# Patient Record
Sex: Male | Born: 1995 | Race: Black or African American | Hispanic: No | Marital: Single | Smoking: Current every day smoker
Health system: Southern US, Community
[De-identification: ages and names within clinical notes are randomized; demographics above are authoritative.]

## PROBLEM LIST (undated history)

## (undated) DIAGNOSIS — J029 Acute pharyngitis, unspecified: Secondary | ICD-10-CM

---
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
## 2015-01-10 ENCOUNTER — Encounter (HOSPITAL_BASED_OUTPATIENT_CLINIC_OR_DEPARTMENT_OTHER): Payer: Self-pay

## 2015-01-10 ENCOUNTER — Emergency Department (HOSPITAL_BASED_OUTPATIENT_CLINIC_OR_DEPARTMENT_OTHER)
Admission: EM | Admit: 2015-01-10 | Discharge: 2015-01-10 | Disposition: A | Discharge status: Home / Self Care | Payer: Medicaid Other | Attending: Emergency Medicine | Admitting: Emergency Medicine

## 2015-01-10 DIAGNOSIS — Z720 Tobacco use: Secondary | ICD-10-CM | POA: Diagnosis not present

## 2015-01-10 DIAGNOSIS — Y9289 Other specified places as the place of occurrence of the external cause: Secondary | ICD-10-CM | POA: Insufficient documentation

## 2015-01-10 DIAGNOSIS — R110 Nausea: Secondary | ICD-10-CM | POA: Diagnosis not present

## 2015-01-10 DIAGNOSIS — S39012A Strain of muscle, fascia and tendon of lower back, initial encounter: Secondary | ICD-10-CM | POA: Insufficient documentation

## 2015-01-10 DIAGNOSIS — X58XXXA Exposure to other specified factors, initial encounter: Secondary | ICD-10-CM | POA: Diagnosis not present

## 2015-01-10 DIAGNOSIS — S3992XA Unspecified injury of lower back, initial encounter: Secondary | ICD-10-CM | POA: Diagnosis present

## 2015-01-10 DIAGNOSIS — M25562 Pain in left knee: Secondary | ICD-10-CM | POA: Diagnosis not present

## 2015-01-10 DIAGNOSIS — Y9389 Activity, other specified: Secondary | ICD-10-CM | POA: Insufficient documentation

## 2015-01-10 DIAGNOSIS — Y998 Other external cause status: Secondary | ICD-10-CM | POA: Diagnosis not present

## 2015-01-10 DIAGNOSIS — T148XXA Other injury of unspecified body region, initial encounter: Secondary | ICD-10-CM

## 2015-01-10 NOTE — Discharge Instructions (Signed)
You may take Ibuprofen and use ice for pain relief.  Please follow up with a primary care provider listed in the Resource Guide provided below in 1 week. Please return to the Emergency Department if symptoms worsen or new onset of fever, numbness, tingling, loss of bowel or bladder, weakness.

## 2015-01-10 NOTE — ED Notes (Signed)
Patient here with multiple complaints of back pain, left knee pain and nausea. He thinks the nausea is related to what he ate last night. Reports knee and back hurting for a while

## 2015-01-10 NOTE — ED Provider Notes (Signed)
CSN: [id number]     Arrival date & time 01/10/15  1255 History   First MD Initiated Contact with Patient 01/10/15 1328     Chief Complaint  Patient presents with  . multiple complaints      (Consider location/radiation/quality/duration/timing/severity/associated sxs/prior Treatment) HPI Comments: Pt is a 19 yo male who presents to the ED with complaint of of lower back pain. Pt reports he has been having intermittent "aching" lower back pain for the past 3 months that is worse with movement. He denies any recent falls, trauma or injury. Pt denies fever, numbness, tingling, loss of bowel or bladder, weakness, IVDU, cancer or recent spinal manipulation. He also reports having left knee pain that started yesterday while skateboarding. He notes he was skateboarding fell off his board and landed in a squatting position with most of his weight no his left knee. He endorses constant aching pain to the left knee, no aggravating or alleviating factors. He also reports being nauseous last night after eating chinese food but notes his nausea has improved. Denies HA, nasal congestion, rhinorrhea, sore throat, cough, SOB, CP, abdominal pain, vomiting, diarrhea, urinary sxs, blood in urine or stool. Pt states he has not taken anything at home for his sxs. No PCP.   History reviewed. No pertinent past medical history. History reviewed. No pertinent past surgical history. No family history on file. Social History  Substance Use Topics  . Smoking status: Current Every Day Smoker  . Smokeless tobacco: None  . Alcohol Use: None    Review of Systems  Gastrointestinal: Positive for nausea.  Musculoskeletal: Positive for back pain.       Left knee pain  All other systems reviewed and are negative.     Allergies  Review of patient's allergies indicates no known allergies.  Home Medications   Prior to Admission medications   Not on File   BP 130/67 mmHg  Pulse 83  Temp(Src) 98.2 F (36.8 C) (Oral)   Resp 18  Ht  (1.88 m)  Wt 145 lb (65.772 kg)  BMI 18.61 kg/m2  SpO2 99% Physical Exam  Constitutional: He is oriented to person, place, and time. He appears well-developed and well-nourished. No distress.  HENT:  Head: Normocephalic and atraumatic.  Mouth/Throat: Oropharynx is clear and moist. No oropharyngeal exudate.  Eyes: Conjunctivae and EOM are normal. Pupils are equal, round, and reactive to light. Right eye exhibits no discharge. Left eye exhibits no discharge. No scleral icterus.  Neck: Normal range of motion. Neck supple.  Cardiovascular: Normal rate, regular rhythm, normal heart sounds and intact distal pulses.   No murmur heard. Pulmonary/Chest: Effort normal and breath sounds normal. No respiratory distress. He has no wheezes. He has no rales. He exhibits no tenderness.  Abdominal: Soft. Bowel sounds are normal. He exhibits no distension and no mass. There is no tenderness. There is no rebound and no guarding.  Musculoskeletal: Normal range of motion. He exhibits no edema or tenderness.       Left knee: Normal. He exhibits normal range of motion, no swelling, no effusion, no ecchymosis, no deformity, no laceration, no erythema, no LCL laxity, normal patellar mobility, no bony tenderness, normal meniscus and no MCL laxity. No tenderness found.       Lumbar back: Normal. He exhibits normal range of motion, no tenderness, no bony tenderness, no swelling, no edema, no deformity, no pain and no spasm.  Lymphadenopathy:    He has no cervical adenopathy.  Neurological: He is alert  and oriented to person, place, and time. He has normal strength and normal reflexes. No cranial nerve deficit or sensory deficit. Coordination and gait normal.  Skin: Skin is warm and dry. He is not diaphoretic.  Nursing note and vitals reviewed.   ED Course  Procedures (including critical care time) Labs Review Labs Reviewed - No data to display  Imaging Review No results found. I have  personally reviewed and evaluated these images and lab results as part of my medical decision-making.  Filed Vitals:   01/10/15 1302  BP: 130/67  Pulse: 83  Temp: 98.2 F (36.8 C)  Resp: 18     MDM   Final diagnoses:  Muscle strain    Pt presents with lower back pain x3 months , left knee pain x yesterday while skateboarding and nausea that occurred last night after eating chinese food that has since resolved. Denies any fall, trauma or injury prior to onset of back pain. Pt denies fever, numbness, tingling, loss of bowel or bladder, weakness, IVDU, cancer or recent spinal manipulation. VSS. Abdominal exam benign. MSK exam benign, FROM of back and BLE. Pt able to stand and ambulate in room. No signs of injury to left knee. I suspect back pain is likely due to musculoskeletal etiology with no red flags present or neuro sxs, likely due to muscle strain. I do not feel that imaging is warranted at this time to evaluate left knee with no signs of injury and normal exam. Discussed plan for d/c with pt. Pt advised to take ibuprofen and use ice for pain relief. Pt given Resource Guide to follow up with PCP.  Evaluation does not show pathology requring ongoing emergent intervention or admission. Pt is hemodynamically stable and mentating appropriately. Discussed findings/results and plan with patient/guardian, who agrees with plan. All questions answered. Return precautions discussed and outpatient follow up given.       Satira [place], [place] 01/10/15 1514  Elwin Mocha, MD 01/10/15 [phone]

## 2015-03-03 ENCOUNTER — Encounter (HOSPITAL_BASED_OUTPATIENT_CLINIC_OR_DEPARTMENT_OTHER): Payer: Self-pay

## 2015-08-31 ENCOUNTER — Encounter (HOSPITAL_BASED_OUTPATIENT_CLINIC_OR_DEPARTMENT_OTHER): Payer: Self-pay | Admitting: *Deleted

## 2015-08-31 DIAGNOSIS — Z5321 Procedure and treatment not carried out due to patient leaving prior to being seen by health care provider: Secondary | ICD-10-CM | POA: Diagnosis not present

## 2015-08-31 DIAGNOSIS — Z87891 Personal history of nicotine dependence: Secondary | ICD-10-CM | POA: Insufficient documentation

## 2015-08-31 DIAGNOSIS — J069 Acute upper respiratory infection, unspecified: Secondary | ICD-10-CM | POA: Diagnosis not present

## 2015-08-31 MED ORDER — ACETAMINOPHEN 325 MG PO TABS
650.0000 mg | ORAL_TABLET | Freq: Once | ORAL | Status: AC
  Administered 2015-08-31: 650 mg via ORAL
  Filled 2015-08-31: qty 2

## 2015-08-31 NOTE — ED Notes (Signed)
Nasal congestion, sneezing, and headache since yesterday.

## 2015-09-01 ENCOUNTER — Emergency Department (HOSPITAL_BASED_OUTPATIENT_CLINIC_OR_DEPARTMENT_OTHER)
Admission: EM | Admit: 2015-09-01 | Discharge: 2015-09-01 | Disposition: A | Discharge status: Home / Self Care | Payer: Medicaid Other | Attending: Dermatology | Admitting: Dermatology

## 2017-11-18 ENCOUNTER — Encounter (HOSPITAL_BASED_OUTPATIENT_CLINIC_OR_DEPARTMENT_OTHER): Payer: Self-pay | Admitting: *Deleted

## 2017-11-18 ENCOUNTER — Emergency Department (HOSPITAL_BASED_OUTPATIENT_CLINIC_OR_DEPARTMENT_OTHER)
Admission: EM | Admit: 2017-11-18 | Discharge: 2017-11-18 | Disposition: A | Discharge status: Home / Self Care | Payer: BLUE CROSS/BLUE SHIELD | Attending: Emergency Medicine | Admitting: Emergency Medicine

## 2017-11-18 ENCOUNTER — Other Ambulatory Visit: Payer: Self-pay

## 2017-11-18 DIAGNOSIS — R070 Pain in throat: Secondary | ICD-10-CM | POA: Diagnosis present

## 2017-11-18 DIAGNOSIS — Z87891 Personal history of nicotine dependence: Secondary | ICD-10-CM | POA: Insufficient documentation

## 2017-11-18 DIAGNOSIS — J039 Acute tonsillitis, unspecified: Secondary | ICD-10-CM | POA: Insufficient documentation

## 2017-11-18 LAB — GROUP A STREP BY PCR: Group A Strep by PCR: NOT DETECTED

## 2017-11-18 MED ORDER — AMOXICILLIN 500 MG PO CAPS: 1000.0000 mg | ORAL_CAPSULE | Freq: Every day | ORAL | 0 refills | Status: DC

## 2017-11-18 MED ORDER — IBUPROFEN 800 MG PO TABS
800.0000 mg | ORAL_TABLET | Freq: Once | ORAL | Status: AC
  Administered 2017-11-18: 800 mg via ORAL
  Filled 2017-11-18: qty 1

## 2017-11-18 MED ORDER — AMOXICILLIN 500 MG PO CAPS
1000.0000 mg | ORAL_CAPSULE | Freq: Once | ORAL | Status: AC
  Administered 2017-11-18: 1000 mg via ORAL
  Filled 2017-11-18: qty 2

## 2017-11-18 NOTE — ED Notes (Signed)
Alert, NAD, calm, interactive, resps e/u, speaking in clear complete sentences, no dyspnea noted, skin hot&D, c/o sore throat and fever, (denies: other pain, sob, nausea, dizziness or visual changes).

## 2017-11-18 NOTE — ED Provider Notes (Addendum)
   [facility] Provider Note: Lowella DellJ. Lane Brittney Caraway, MD, FACEP  CSN: [id number] MRN: [id number] ARRIVAL: 11/18/17 at 2059 ROOM: [facility]06/[facility]06   CHIEF COMPLAINT  Sore Throat   HISTORY OF PRESENT ILLNESS  11/18/17 11:12 PM Brandon Fisher is a 22 y.o. male with about 3 days of sore throat and 1 day of fever.  Pain is moderate and worse with swallowing.  His temperature was noted to be 102.2 on arrival and he was given ibuprofen with improvement in his fever as well as his pain.  Associated symptoms include headache and body aches.  He denies nasal congestion or cough.   History reviewed. No pertinent past medical history.  History reviewed. No pertinent surgical history.  History reviewed. No pertinent family history.  Social History   Tobacco Use  . Smoking status: Former Smoker  Substance Use Topics  . Alcohol use: No  . Drug use: No    Prior to Admission medications   Medication Sig Start Date End Date Taking? Authorizing Provider  loratadine (CLARITIN) 10 MG tablet Take 10 mg by mouth once.      [provider]    Allergies Patient has no known allergies.   REVIEW OF SYSTEMS  Negative except as noted here or in the History of Present Illness.   PHYSICAL EXAMINATION  Initial Vital Signs Blood pressure (!) 147/68, pulse 99, temperature (!) 102.2 F (39 C), temperature source (S) Oral, resp. rate 18, height 6\' 2"  (1.88 m), weight 72.6 kg, SpO2 98 %.  Examination General: Well-developed, well-nourished male in no acute distress; appearance consistent with age of record HENT: normocephalic; atraumatic; tonsillar exudate; no trismus; uvula midline Eyes: pupils equal, round and reactive to light; extraocular muscles intact Neck: supple; no lymphadenopathy Heart: regular rate and rhythm Lungs: clear to auscultation bilaterally Abdomen: soft; nondistended; nontender; bowel sounds present Extremities: No deformity; full range of motion Neurologic: Awake, alert  and oriented; motor function intact in all extremities and symmetric; no facial droop Skin: Warm and dry Psychiatric: Normal mood and affect   RESULTS  Summary of this visit's results, reviewed by myself:   EKG Interpretation  Date/Time:    Ventricular Rate:    PR Interval:    QRS Duration:   QT Interval:    QTC Calculation:   R Axis:     Text Interpretation:        Laboratory Studies: Results for orders placed or performed during the hospital encounter of 11/18/17 (from the past 24 hour(s))  Group A Strep by PCR     Status: None   Collection Time: 11/18/17  9:06 PM  Result Value Ref Range   Group A Strep by PCR NOT DETECTED NOT DETECTED   Imaging Studies: No results found.  ED COURSE and MDM  Nursing notes and initial vitals signs, including pulse oximetry, reviewed.  Vitals:   11/18/17 2103 11/18/17 2104 11/18/17 2247  BP: (!) 147/68    Pulse: 99    Resp: 18    Temp: 100.1 F (37.8 C)  (!) 102.2 F (39 C)  TempSrc: Oral  (S) Oral  SpO2: 98%    Weight:  72.6 kg   Height:  6\' 2"  (1.88 m)    Examination consistent with tonsillitis.  PROCEDURES    ED DIAGNOSES     ICD-10-CM   1. Tonsillitis J03.90        Brogan Martis, Jonny RuizJohn, MD 11/18/17 2318    Paula LibraMolpus, Lynix Bonine, MD 11/18/17 2320

## 2017-11-18 NOTE — ED Triage Notes (Signed)
Sore throat and fever x 4 days

## 2018-11-05 ENCOUNTER — Emergency Department (HOSPITAL_BASED_OUTPATIENT_CLINIC_OR_DEPARTMENT_OTHER)
Admission: EM | Admit: 2018-11-05 | Discharge: 2018-11-05 | Disposition: A | Discharge status: Home / Self Care | Payer: BC Managed Care – PPO | Attending: Emergency Medicine | Admitting: Emergency Medicine

## 2018-11-05 ENCOUNTER — Other Ambulatory Visit: Payer: Self-pay

## 2018-11-05 ENCOUNTER — Encounter (HOSPITAL_BASED_OUTPATIENT_CLINIC_OR_DEPARTMENT_OTHER): Payer: Self-pay | Admitting: *Deleted

## 2018-11-05 DIAGNOSIS — Z87891 Personal history of nicotine dependence: Secondary | ICD-10-CM | POA: Diagnosis not present

## 2018-11-05 DIAGNOSIS — J029 Acute pharyngitis, unspecified: Secondary | ICD-10-CM | POA: Diagnosis present

## 2018-11-05 DIAGNOSIS — Z20828 Contact with and (suspected) exposure to other viral communicable diseases: Secondary | ICD-10-CM | POA: Diagnosis not present

## 2018-11-05 DIAGNOSIS — R509 Fever, unspecified: Secondary | ICD-10-CM | POA: Insufficient documentation

## 2018-11-05 DIAGNOSIS — Z79899 Other long term (current) drug therapy: Secondary | ICD-10-CM | POA: Diagnosis not present

## 2018-11-05 LAB — GROUP A STREP BY PCR: Group A Strep by PCR: NOT DETECTED

## 2018-11-05 NOTE — ED Provider Notes (Signed)
[facility] EMERGENCY DEPARTMENT Provider Note   CSN: [id number] Arrival date & time: 11/05/18  1430     History   Chief Complaint Chief Complaint  Patient presents with  . Sore Throat    HPI Brandon Fisher is a 23 y.o. male.     Patient with a complaint of sore throat for 3 days.  Questionable fever.  Patient does get pharyngitis frequently last time was negative for strep pharyngitis.  No significant cough or shortness of breath.  No runny nose.     History reviewed. No pertinent past medical history.  There are no active problems to display for this patient.   History reviewed. No pertinent surgical history.      Home Medications    Prior to Admission medications   Medication Sig Start Date End Date Taking? Authorizing Provider  amoxicillin (AMOXIL) 500 MG capsule Take 2 capsules (1,000 mg total) by mouth daily. 11/18/17   Molpus, John, MD  loratadine (CLARITIN) 10 MG tablet Take 10 mg by mouth once.      [provider]    Family History No family history on file.  Social History Social History   Tobacco Use  . Smoking status: Former Games developermoker  . Smokeless tobacco: Never Used  Substance Use Topics  . Alcohol use: No  . Drug use: No     Allergies   Patient has no known allergies.   Review of Systems Review of Systems  Constitutional: Positive for fever. Negative for chills.  HENT: Positive for sore throat. Negative for congestion, rhinorrhea and trouble swallowing.   Eyes: Negative for visual disturbance.  Respiratory: Negative for cough and shortness of breath.   Cardiovascular: Negative for chest pain and leg swelling.  Gastrointestinal: Negative for abdominal pain, diarrhea, nausea and vomiting.  Genitourinary: Negative for dysuria.  Musculoskeletal: Negative for back pain and neck pain.  Skin: Negative for rash.  Neurological: Negative for dizziness, light-headedness and headaches.  Hematological: Does not bruise/bleed  easily.  Psychiatric/Behavioral: Negative for confusion.     Physical Exam Updated Vital Signs BP 140/78 (BP Location: Right Arm)   Pulse 62   Temp 98.3 F (36.8 C) (Oral)   Resp 14   Ht 1.905 m (6\' 3" )   Wt 72.6 kg   SpO2 100%   BMI 20.00 kg/m   Physical Exam Vitals signs and nursing note reviewed.  Constitutional:      Appearance: Normal appearance. He is well-developed.  HENT:     Head: Normocephalic and atraumatic.     Mouth/Throat:     Mouth: Mucous membranes are moist.     Pharynx: Posterior oropharyngeal erythema present. No oropharyngeal exudate.     Comments: Uvula midline.  Tonsils not enlarged.  No exudate. Eyes:     Extraocular Movements: Extraocular movements intact.     Conjunctiva/sclera: Conjunctivae normal.     Pupils: Pupils are equal, round, and reactive to light.  Neck:     Musculoskeletal: Normal range of motion and neck supple. No neck rigidity.  Cardiovascular:     Rate and Rhythm: Normal rate and regular rhythm.     Heart sounds: No murmur.  Pulmonary:     Effort: Pulmonary effort is normal. No respiratory distress.     Breath sounds: Normal breath sounds.  Abdominal:     Palpations: Abdomen is soft.     Tenderness: There is no abdominal tenderness.  Musculoskeletal: Normal range of motion.  Skin:    General: Skin is warm and  dry.  Neurological:     General: No focal deficit present.     Mental Status: He is alert and oriented to person, place, and time.      ED Treatments / Results  Labs (all labs ordered are listed, but only abnormal results are displayed) Labs Reviewed  GROUP A STREP BY PCR  NOVEL CORONAVIRUS, NAA (HOSPITAL ORDER, SEND-OUT TO REF LAB)    EKG None  Radiology No results found.  Procedures Procedures (including critical care time)  Medications Ordered in ED Medications - No data to display   Initial Impression / Assessment and Plan / ED Course  I have reviewed the triage vital signs and the nursing  notes.  Pertinent labs & imaging results that were available during my care of the patient were reviewed by me and considered in my medical decision making (see chart for details).        Strep test negative.  Symptoms early on for checking for mononucleosis.  But patient with questionable fever so we will have COVID testing and will self quarantine.  Work note provided.  Outpatient COVID test results should be back in 2 to 4 days.  Patient will return for any new or worse symptoms.  Final Clinical Impressions(s) / ED Diagnoses   Final diagnoses:  Pharyngitis, unspecified etiology    ED Discharge Orders    None       Fredia Sorrow, MD 11/05/18 1627

## 2018-11-05 NOTE — Discharge Instructions (Addendum)
He should receive your COVID test results in 2 to 4 days.  Self quarantine at home in the meantime.  Work note provided.  Recommend Tylenol for the sore throat.  Return for any new or worse symptoms.  Strep testing was negative.

## 2018-11-05 NOTE — ED Triage Notes (Signed)
Sore throat x 3 days

## 2018-11-07 LAB — NOVEL CORONAVIRUS, NAA (HOSP ORDER, SEND-OUT TO REF LAB; TAT 18-24 HRS): SARS-CoV-2, NAA: NOT DETECTED

## 2018-12-03 ENCOUNTER — Emergency Department (HOSPITAL_BASED_OUTPATIENT_CLINIC_OR_DEPARTMENT_OTHER): Payer: BC Managed Care – PPO

## 2018-12-03 ENCOUNTER — Emergency Department (HOSPITAL_BASED_OUTPATIENT_CLINIC_OR_DEPARTMENT_OTHER)
Admission: EM | Admit: 2018-12-03 | Discharge: 2018-12-03 | Disposition: A | Discharge status: Home / Self Care | Payer: BC Managed Care – PPO | Attending: Emergency Medicine | Admitting: Emergency Medicine

## 2018-12-03 ENCOUNTER — Other Ambulatory Visit: Payer: Self-pay

## 2018-12-03 ENCOUNTER — Encounter (HOSPITAL_BASED_OUTPATIENT_CLINIC_OR_DEPARTMENT_OTHER): Payer: Self-pay | Admitting: Emergency Medicine

## 2018-12-03 DIAGNOSIS — R509 Fever, unspecified: Secondary | ICD-10-CM | POA: Diagnosis present

## 2018-12-03 DIAGNOSIS — J988 Other specified respiratory disorders: Secondary | ICD-10-CM

## 2018-12-03 DIAGNOSIS — Z87891 Personal history of nicotine dependence: Secondary | ICD-10-CM | POA: Insufficient documentation

## 2018-12-03 DIAGNOSIS — Z20828 Contact with and (suspected) exposure to other viral communicable diseases: Secondary | ICD-10-CM | POA: Insufficient documentation

## 2018-12-03 DIAGNOSIS — J069 Acute upper respiratory infection, unspecified: Secondary | ICD-10-CM | POA: Diagnosis not present

## 2018-12-03 DIAGNOSIS — B9789 Other viral agents as the cause of diseases classified elsewhere: Secondary | ICD-10-CM

## 2018-12-03 HISTORY — DX: Acute pharyngitis, unspecified: J02.9

## 2018-12-03 LAB — CBC WITH DIFFERENTIAL/PLATELET
Abs Immature Granulocytes: 0.07 10*3/uL (ref 0.00–0.07)
Basophils Absolute: 0 10*3/uL (ref 0.0–0.1)
Basophils Relative: 0 %
Eosinophils Absolute: 0 10*3/uL (ref 0.0–0.5)
Eosinophils Relative: 0 %
HCT: 41.8 % (ref 39.0–52.0)
Hemoglobin: 14.2 g/dL (ref 13.0–17.0)
Immature Granulocytes: 1 %
Lymphocytes Relative: 6 %
Lymphs Abs: 0.7 10*3/uL (ref 0.7–4.0)
MCH: 30.5 pg (ref 26.0–34.0)
MCHC: 34 g/dL (ref 30.0–36.0)
MCV: 89.7 fL (ref 80.0–100.0)
Monocytes Absolute: 0.9 10*3/uL (ref 0.1–1.0)
Monocytes Relative: 8 %
Neutro Abs: 10.2 10*3/uL — ABNORMAL HIGH (ref 1.7–7.7)
Neutrophils Relative %: 85 %
Platelets: 161 10*3/uL (ref 150–400)
RBC: 4.66 MIL/uL (ref 4.22–5.81)
RDW: 12.4 % (ref 11.5–15.5)
WBC: 12 10*3/uL — ABNORMAL HIGH (ref 4.0–10.5)
nRBC: 0 % (ref 0.0–0.2)

## 2018-12-03 LAB — COMPREHENSIVE METABOLIC PANEL
ALT: 13 U/L (ref 0–44)
AST: 19 U/L (ref 15–41)
Albumin: 4.3 g/dL (ref 3.5–5.0)
Alkaline Phosphatase: 70 U/L (ref 38–126)
Anion gap: 11 (ref 5–15)
BUN: 9 mg/dL (ref 6–20)
CO2: 21 mmol/L — ABNORMAL LOW (ref 22–32)
Calcium: 9 mg/dL (ref 8.9–10.3)
Chloride: 102 mmol/L (ref 98–111)
Creatinine, Ser: 1.28 mg/dL — ABNORMAL HIGH (ref 0.61–1.24)
GFR calc Af Amer: >60 mL/min (ref >60)
GFR calc non Af Amer: >60 mL/min (ref >60)
Glucose, Bld: 110 mg/dL — ABNORMAL HIGH (ref 70–99)
Potassium: 3.4 mmol/L — ABNORMAL LOW (ref 3.5–5.1)
Sodium: 134 mmol/L — ABNORMAL LOW (ref 135–145)
Total Bilirubin: 1 mg/dL (ref 0.3–1.2)
Total Protein: 7.9 g/dL (ref 6.5–8.1)

## 2018-12-03 LAB — URINALYSIS, ROUTINE W REFLEX MICROSCOPIC
Bilirubin Urine: NEGATIVE
Glucose, UA: NEGATIVE mg/dL
Hgb urine dipstick: NEGATIVE
Ketones, ur: 15 mg/dL — AB
Leukocytes,Ua: NEGATIVE
Nitrite: NEGATIVE
Protein, ur: NEGATIVE mg/dL
Specific Gravity, Urine: 1.02 (ref 1.005–1.030)
pH: 8.5 — ABNORMAL HIGH (ref 5.0–8.0)

## 2018-12-03 LAB — LIPASE, BLOOD: Lipase: 22 U/L (ref 11–51)

## 2018-12-03 LAB — GROUP A STREP BY PCR: Group A Strep by PCR: NOT DETECTED

## 2018-12-03 MED ORDER — POTASSIUM CHLORIDE CRYS ER 20 MEQ PO TBCR
40.0000 meq | EXTENDED_RELEASE_TABLET | Freq: Once | ORAL | Status: AC
  Administered 2018-12-03: 40 meq via ORAL
  Filled 2018-12-03: qty 2

## 2018-12-03 MED ORDER — ACETAMINOPHEN 325 MG PO TABS
650.0000 mg | ORAL_TABLET | Freq: Once | ORAL | Status: AC
  Administered 2018-12-03: 650 mg via ORAL
  Filled 2018-12-03: qty 2

## 2018-12-03 MED ORDER — ONDANSETRON 4 MG PO TBDP
4.0000 mg | ORAL_TABLET | Freq: Once | ORAL | Status: AC
  Administered 2018-12-03: 09:00:00 4 mg via ORAL
  Filled 2018-12-03: qty 1

## 2018-12-03 MED ORDER — SODIUM CHLORIDE 0.9 % IV BOLUS: 1000.0000 mL | Freq: Once | INTRAVENOUS | Status: DC

## 2018-12-03 NOTE — ED Triage Notes (Addendum)
Sore throat and fever since last night. Has not taken any meds this morning.  Vomited x4 this morning.

## 2018-12-03 NOTE — ED Provider Notes (Signed)
[facility] EMERGENCY DEPARTMENT Provider Note   CSN: [id number] Arrival date & time: 12/03/18  0847     History   Chief Complaint Chief Complaint  Patient presents with  . Sore throat and fever    HPI Brandon Fisher is a 23 y.o. male.     HPI  23 year old male presents with fever, sore throat and headache.  Yesterday he developed a sore throat and shortly thereafter a headache.  After a nap he noticed a subjective fever.  Symptoms are worse today and he vomited multiple times this morning.  Overall his symptoms are about a 6 out of 10.  The sore throat is worse on the left side.  No neck stiffness.  No cough, shortness of breath, diarrhea.  A little bit of upper abdominal pain but he thinks is because he did not eat this morning.  He took ibuprofen yesterday with partial relief. No known sick contacts.  Past Medical History:  Diagnosis Date  . Pharyngitis     There are no active problems to display for this patient.   History reviewed. No pertinent surgical history.      Home Medications    Prior to Admission medications   Not on File    Family History No family history on file.  Social History Social History   Tobacco Use  . Smoking status: Former Research scientist (life sciences)  . Smokeless tobacco: Never Used  Substance Use Topics  . Alcohol use: No  . Drug use: No     Allergies   Patient has no known allergies.   Review of Systems Review of Systems  Constitutional: Positive for fever.  HENT: Positive for sore throat.   Eyes: Negative for photophobia and visual disturbance.  Respiratory: Negative for cough and shortness of breath.   Cardiovascular: Negative for chest pain.  Gastrointestinal: Positive for abdominal pain and vomiting. Negative for diarrhea.  Neurological: Positive for headaches. Negative for weakness and numbness.  All other systems reviewed and are negative.    Physical Exam Updated Vital Signs BP 140/80 (BP Location: Right Arm)   Pulse  95   Temp (!) 103 F (39.4 C) (Oral)   Resp 20   Ht 6\' 3"  (1.905 m)   Wt 74.9 kg   SpO2 100%   BMI 20.64 kg/m   Physical Exam Vitals signs and nursing note reviewed.  Constitutional:      General: He is not in acute distress.    Appearance: He is well-developed. He is not ill-appearing or diaphoretic.  HENT:     Head: Normocephalic and atraumatic.     Right Ear: External ear normal.     Left Ear: External ear normal.     Nose: Nose normal.     Mouth/Throat:     Mouth: Mucous membranes are moist.     Pharynx: Oropharynx is clear. Posterior oropharyngeal erythema present.  Eyes:     General:        Right eye: No discharge.        Left eye: No discharge.     Extraocular Movements: Extraocular movements intact.     Pupils: Pupils are equal, round, and reactive to light.  Neck:     Musculoskeletal: Normal range of motion and neck supple. No neck rigidity.  Cardiovascular:     Rate and Rhythm: Normal rate and regular rhythm.     Heart sounds: Normal heart sounds.  Pulmonary:     Effort: Pulmonary effort is normal.     Breath  sounds: Normal breath sounds.  Abdominal:     General: There is no distension.     Palpations: Abdomen is soft.     Tenderness: There is no abdominal tenderness.  Lymphadenopathy:     Cervical: No cervical adenopathy.  Skin:    General: Skin is warm and dry.  Neurological:     Mental Status: He is alert.  Psychiatric:        Mood and Affect: Mood is not anxious.      ED Treatments / Results  Labs (all labs ordered are listed, but only abnormal results are displayed) Labs Reviewed  COMPREHENSIVE METABOLIC PANEL - Abnormal; Notable for the following components:      Result Value   Sodium 134 (*)    Potassium 3.4 (*)    CO2 21 (*)    Glucose, Bld 110 (*)    Creatinine, Ser 1.28 (*)    All other components within normal limits  CBC WITH DIFFERENTIAL/PLATELET - Abnormal; Notable for the following components:   WBC 12.0 (*)    Neutro Abs  10.2 (*)    All other components within normal limits  URINALYSIS, ROUTINE W REFLEX MICROSCOPIC - Abnormal; Notable for the following components:   pH 8.5 (*)    Ketones, ur 15 (*)    All other components within normal limits  GROUP A STREP BY PCR  NOVEL CORONAVIRUS, NAA (HOSPITAL ORDER, SEND-OUT TO REF LAB)  LIPASE, BLOOD    EKG None  Radiology Dg Chest Portable 1 View  Result Date: 12/03/2018 CLINICAL DATA:  Fever. EXAM: PORTABLE CHEST 1 VIEW COMPARISON:  None. FINDINGS: The heart size and mediastinal contours are within normal limits. Both lungs are clear. The visualized skeletal structures are unremarkable. IMPRESSION: No active disease. Electronically Signed   By: Lupita RaiderJames  Green Jr M.D.   On: 12/03/2018 09:54    Procedures Procedures (including critical care time)  Medications Ordered in ED Medications  sodium chloride 0.9 % bolus 1,000 mL (has no administration in time range)  potassium chloride SA (K-DUR) CR tablet 40 mEq (has no administration in time range)  acetaminophen (TYLENOL) tablet 650 mg (650 mg Oral Given 12/03/18 0916)  ondansetron (ZOFRAN-ODT) disintegrating tablet 4 mg (4 mg Oral Given 12/03/18 0916)     Initial Impression / Assessment and Plan / ED Course  I have reviewed the triage vital signs and the nursing notes.  Pertinent labs & imaging results that were available during my care of the patient were reviewed by me and considered in my medical decision making (see chart for details).        Overall, patient likely has a viral illness.  Given the vomiting, labs were obtained.  Mild leukocytosis could be from his illness versus degranulation from vomiting.  Potassium will be repleted.  He was given fluids and Tylenol.  He feels better at this time.  While he does have a headache and fever, my suspicion for meningitis or encephalitis is pretty low.  No photophobia, neck pain/stiffness, meningismus, etc.  More likely viral, given the current pandemic will  treat as if possible COVID-19 and send outpatient test.  Otherwise appears well for discharge home and we discussed return precautions.  Brandon Fisher was evaluated in Emergency Department on 12/03/2018 for the symptoms described in the history of present illness. He was evaluated in the context of the global COVID-19 pandemic, which necessitated consideration that the patient might be at risk for infection with the SARS-CoV-2 virus that causes COVID-19. Institutional  protocols and algorithms that pertain to the evaluation of patients at risk for COVID-19 are in a state of rapid change based on information released by regulatory bodies including the CDC and federal and state organizations. These policies and algorithms were followed during the patient's care in the ED.   Final Clinical Impressions(s) / ED Diagnoses   Final diagnoses:  Viral respiratory illness    ED Discharge Orders    None       Pricilla LovelessGoldston, Glendal Cassaday, MD 12/03/18 1108

## 2018-12-03 NOTE — Discharge Instructions (Addendum)
If you develop continued, recurrent, or worsening headache, fever, neck stiffness, vomiting, blurry or double vision, weakness or numbness in your arms or legs, trouble speaking, or any other new/concerning symptoms then return to the ER for evaluation.   You are being tested for the novel coronavirus.  Please isolate yourself until these results come back.

## 2018-12-04 LAB — NOVEL CORONAVIRUS, NAA (HOSP ORDER, SEND-OUT TO REF LAB; TAT 18-24 HRS): SARS-CoV-2, NAA: NOT DETECTED

## 2021-04-20 IMAGING — DX PORTABLE CHEST - 1 VIEW
1 series · 1 of 1 positions shown · non-contrast
Comparison: None.

CLINICAL DATA: Fever.

EXAM:
PORTABLE CHEST 1 VIEW

[chest ap]
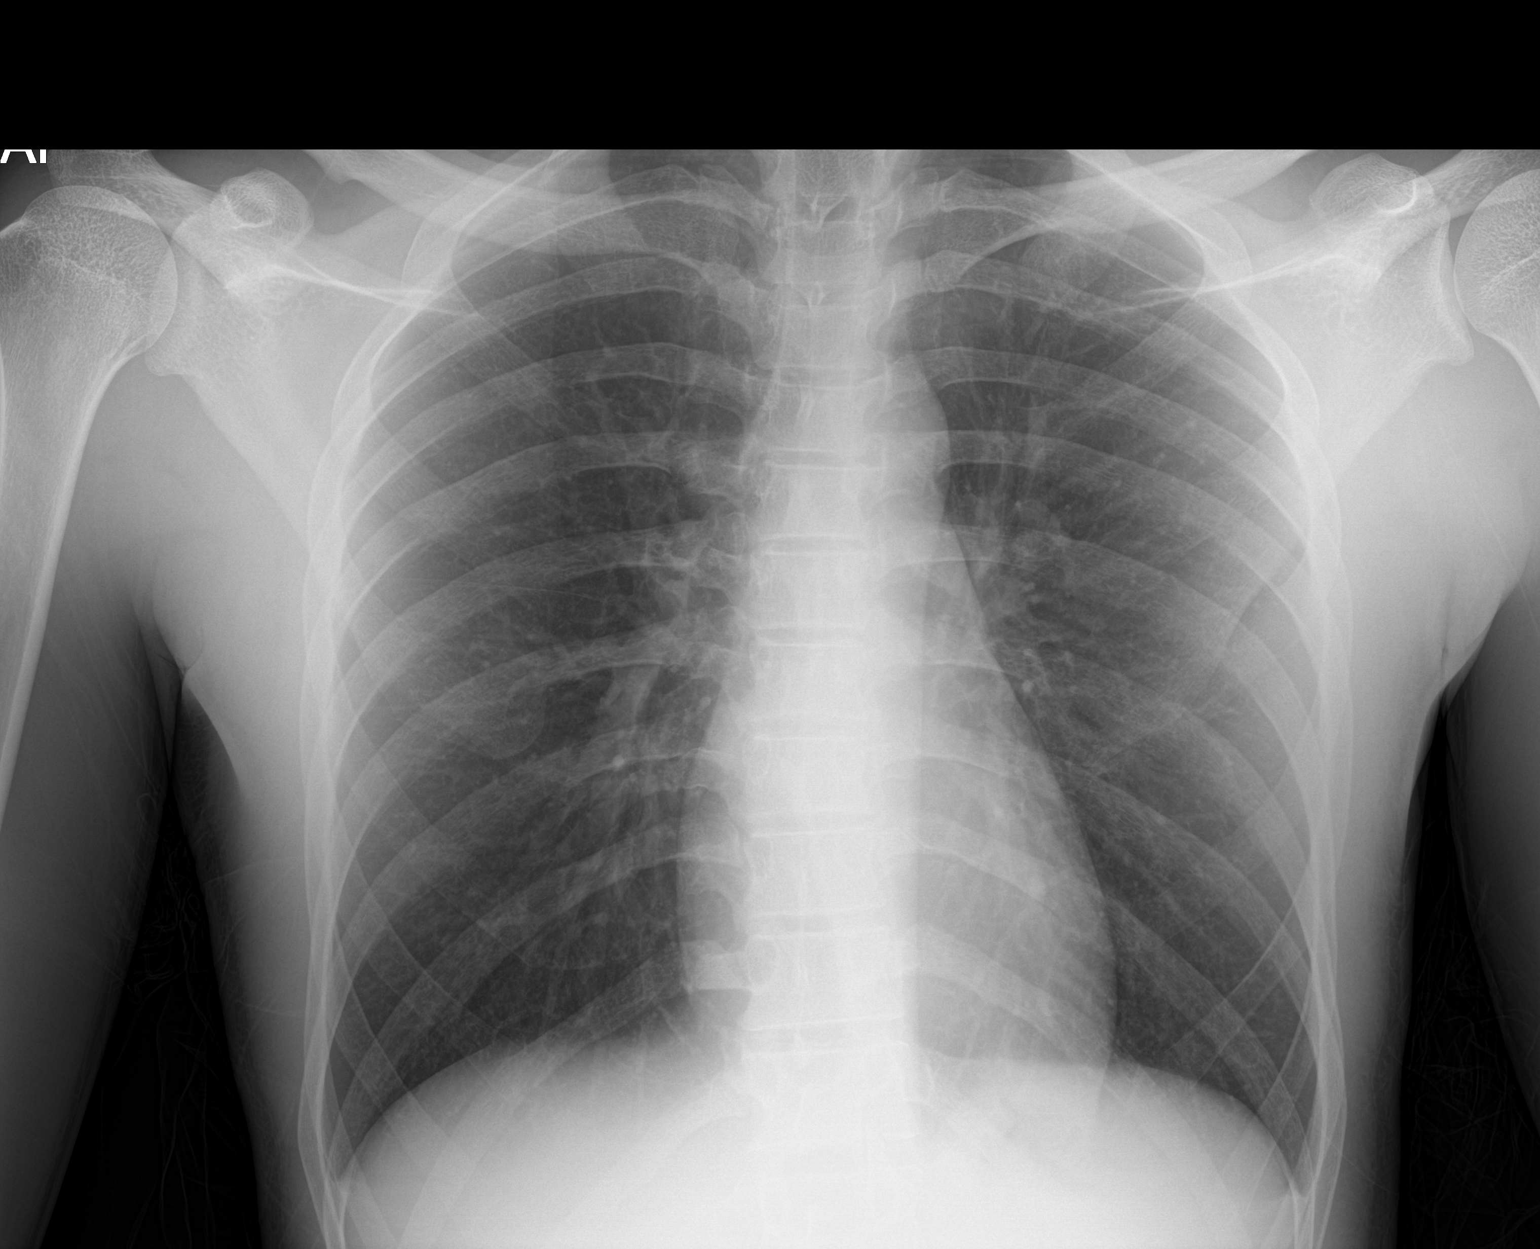

[1 of 1 positions shown; findings below may reference images not displayed]

FINDINGS: The heart size and mediastinal contours are within normal limits.
Both lungs are clear. The visualized skeletal structures are
unremarkable.
IMPRESSION: No active disease.

## 2021-11-13 ENCOUNTER — Encounter (HOSPITAL_BASED_OUTPATIENT_CLINIC_OR_DEPARTMENT_OTHER): Payer: Self-pay | Admitting: Emergency Medicine

## 2021-11-13 ENCOUNTER — Emergency Department (HOSPITAL_BASED_OUTPATIENT_CLINIC_OR_DEPARTMENT_OTHER)
Admission: EM | Admit: 2021-11-13 | Discharge: 2021-11-13 | Disposition: A | Discharge status: Home / Self Care | Payer: BC Managed Care – PPO | Attending: Emergency Medicine | Admitting: Emergency Medicine

## 2021-11-13 ENCOUNTER — Other Ambulatory Visit: Payer: Self-pay

## 2021-11-13 DIAGNOSIS — Z23 Encounter for immunization: Secondary | ICD-10-CM | POA: Insufficient documentation

## 2021-11-13 DIAGNOSIS — S61217A Laceration without foreign body of left little finger without damage to nail, initial encounter: Secondary | ICD-10-CM | POA: Insufficient documentation

## 2021-11-13 DIAGNOSIS — W268XXA Contact with other sharp object(s), not elsewhere classified, initial encounter: Secondary | ICD-10-CM | POA: Insufficient documentation

## 2021-11-13 MED ORDER — LIDOCAINE HCL (PF) 1 % IJ SOLN
10.0000 mL | Freq: Once | INTRAMUSCULAR | Status: AC
  Administered 2021-11-13: 10 mL
  Filled 2021-11-13: qty 10

## 2021-11-13 MED ORDER — CEPHALEXIN 500 MG PO CAPS: 500.0000 mg | ORAL_CAPSULE | Freq: Three times a day (TID) | ORAL | 0 refills | Status: AC

## 2021-11-13 MED ORDER — TETANUS-DIPHTH-ACELL PERTUSSIS 5-2.5-18.5 LF-MCG/0.5 IM SUSY
0.5000 mL | PREFILLED_SYRINGE | Freq: Once | INTRAMUSCULAR | Status: AC
  Administered 2021-11-13: 0.5 mL via INTRAMUSCULAR
  Filled 2021-11-13: qty 0.5

## 2021-11-13 NOTE — ED Notes (Signed)
Patient cut finger around 11 am . .Full movement  Alert x4 . States that he came in due to finger continuous bleeding

## 2021-11-13 NOTE — ED Provider Notes (Signed)
[facility] EMERGENCY DEPARTMENT Provider Note   CSN: [id number] Arrival date & time: 11/13/21  1544     History  Chief Complaint  Patient presents with   Laceration    Brandon Fisher is a 26 y.o. male.  26 year old male presents for evaluation of laceration left pinky finger.  This occurred around 11 AM.  He presents because bleeding has not been controlled.  This was secondary to a box cutter.  He states the bleed was relatively new and he only uses it to cut boxes.  He is unsure of his last tetanus shot.  The history is provided by the patient. No language interpreter was used.       Home Medications Prior to Admission medications   Not on File      Allergies    Patient has no known allergies.    Review of Systems   Review of Systems  Constitutional:  Negative for chills and fever.  Skin:  Positive for wound.  All other systems reviewed and are negative.   Physical Exam Updated Vital Signs BP 129/77   Pulse 80   Temp 98.2 F (36.8 C) (Oral)   Resp 18   Ht 6\' 3"  (1.905 m)   Wt 81.6 kg   SpO2 98%   BMI 22.50 kg/m  Physical Exam Vitals and nursing note reviewed.  Constitutional:      General: He is not in acute distress.    Appearance: Normal appearance. He is not ill-appearing.  HENT:     Head: Normocephalic and atraumatic.     Nose: Nose normal.  Eyes:     Conjunctiva/sclera: Conjunctivae normal.  Pulmonary:     Effort: Pulmonary effort is normal. No respiratory distress.  Musculoskeletal:        General: No deformity.  Skin:    Findings: No rash.     Comments: About 2 cm curvilinear laceration to left pinky finger.  Minimal active bleed noted.  Neurological:     Mental Status: He is alert.     ED Results / Procedures / Treatments   Labs (all labs ordered are listed, but only abnormal results are displayed) Labs Reviewed - No data to display  EKG None  Radiology No results found.  Procedures . Laceration  Repair  Date/Time: 11/13/2021 5:27 PM  Performed by: 01/13/2022, PA-C Authorized by: Marita Kansas, PA-C   Consent:    Consent obtained:  Verbal   Consent given by:  Patient   Risks discussed:  Infection, need for additional repair, pain, poor cosmetic result and poor wound healing   Alternatives discussed:  No treatment and delayed treatment Universal protocol:    Procedure explained and questions answered to patient or proxy's satisfaction: yes     Relevant documents present and verified: yes     Test results available: yes     Imaging studies available: yes     Patient identity confirmed:  Verbally with patient Anesthesia:    Anesthesia method:  Local infiltration   Local anesthetic:  Lidocaine 1% w/o epi Laceration details:    Location:  Finger   Finger location:  L small finger   Length (cm):  2 Pre-procedure details:    Preparation:  Patient was prepped and draped in usual sterile fashion Exploration:    Wound extent: no muscle damage noted, no nerve damage noted, no tendon damage noted and no underlying fracture noted   Treatment:    Area cleansed with:  Povidone-iodine   Amount of cleaning:  Standard   Irrigation solution:  Sterile saline   Irrigation volume:  250   Irrigation method:  Pressure wash   Debridement:  None Skin repair:    Repair method:  Sutures   Suture size:  5-0   Suture material:  Prolene   Number of sutures:  4 Approximation:    Approximation:  Close Repair type:    Repair type:  Simple Post-procedure details:    Dressing:  Open (no dressing)   Procedure completion:  Tolerated well, no immediate complications     Medications Ordered in ED Medications  Tdap (BOOSTRIX) injection 0.5 mL (0.5 mLs Intramuscular Given 11/13/21 1646)  lidocaine (PF) (XYLOCAINE) 1 % injection 10 mL (10 mLs Infiltration Given 11/13/21 1646)    ED Course/ Medical Decision Making/ A&P                           Medical Decision Making Risk Prescription drug  management.   26 year old male presents following a laceration to his left pinky finger.  Minimal active bleed noted.  Unknown last tetanus shot.  Tetanus shot updated today.  For stitches placed in a wound with close approximation.  Wound cleansed.  Will provide antibiotics given potential for dirty bleed.  Discussed signs and symptoms of infection warranting return for evaluation.  Final Clinical Impression(s) / ED Diagnoses Final diagnoses:  Laceration of left little finger without foreign body without damage to nail, initial encounter    Rx / DC Orders ED Discharge Orders          Ordered    cephALEXin (KEFLEX) 500 MG capsule  3 times daily        11/13/21 1755              Marita Kansas, PA-C 11/13/21 1755    Derwood Kaplan, MD 11/13/21 [phone]

## 2021-11-13 NOTE — ED Triage Notes (Signed)
Pt arrives pov, steady gait, c/o laceration to left pinky finger today by razor. Bleeding controlled. Last tetanus unk.

## 2021-11-13 NOTE — Discharge Instructions (Signed)
Your laceration was repaired with 4 stitches.  These will need to be removed in 7 days.  You can go to an urgent care or return to the emergency room.  I have sent antibiotic into the pharmacy for you.  For any signs concerning for infection including worsening swelling, purulent drainage, fever, or worsening redness surrounding the area please return for evaluation.
